# Patient Record
Sex: Male | Born: 1935
Health system: Southern US, Community
[De-identification: ages and names within clinical notes are randomized; demographics above are authoritative.]

---

## 2016-12-07 ENCOUNTER — Inpatient Hospital Stay
Admission: RE | Admit: 2016-12-07 | Discharge: 2017-01-11 | Disposition: E | Payer: Self-pay | Source: Hospice | Attending: Internal Medicine | Admitting: Internal Medicine

## 2016-12-07 ENCOUNTER — Other Ambulatory Visit (HOSPITAL_COMMUNITY): Payer: Self-pay

## 2016-12-07 DIAGNOSIS — Z95828 Presence of other vascular implants and grafts: Secondary | ICD-10-CM

## 2016-12-07 DIAGNOSIS — Z01818 Encounter for other preprocedural examination: Secondary | ICD-10-CM

## 2016-12-07 DIAGNOSIS — J9600 Acute respiratory failure, unspecified whether with hypoxia or hypercapnia: Secondary | ICD-10-CM

## 2016-12-07 DIAGNOSIS — Z9911 Dependence on respirator [ventilator] status: Secondary | ICD-10-CM

## 2016-12-07 DIAGNOSIS — J189 Pneumonia, unspecified organism: Secondary | ICD-10-CM

## 2016-12-07 MED ORDER — IOPAMIDOL (ISOVUE-300) INJECTION 61%
INTRAVENOUS | Status: AC
  Administered 2016-12-07: 30 mL via GASTROSTOMY
  Filled 2016-12-07: qty 50

## 2016-12-08 ENCOUNTER — Other Ambulatory Visit (HOSPITAL_COMMUNITY): Payer: Self-pay

## 2016-12-08 LAB — BLOOD GAS, ARTERIAL
ACID-BASE EXCESS: 0.7 mmol/L (ref 0.0–2.0)
Acid-Base Excess: 3.7 mmol/L — ABNORMAL HIGH (ref 0.0–2.0)
BICARBONATE: 24.8 mmol/L (ref 20.0–28.0)
BICARBONATE: 27.1 mmol/L (ref 20.0–28.0)
FIO2: 100
O2 Content: 2 L/min
O2 SAT: 97.4 %
O2 Saturation: 96.6 %
PATIENT TEMPERATURE: 96.5
PCO2 ART: 37.3 mmHg (ref 32.0–48.0)
PH ART: 7.476 — AB (ref 7.350–7.450)
PO2 ART: 96 mmHg (ref 83.0–108.0)
Patient temperature: 98.6
pCO2 arterial: 37.2 mmHg (ref 32.0–48.0)
pH, Arterial: 7.432 (ref 7.350–7.450)
pO2, Arterial: 84.2 mmHg (ref 83.0–108.0)

## 2016-12-08 LAB — COMPREHENSIVE METABOLIC PANEL
ALT: 41 U/L (ref 17–63)
ANION GAP: 10 (ref 5–15)
AST: 42 U/L — ABNORMAL HIGH (ref 15–41)
Albumin: 2.9 g/dL — ABNORMAL LOW (ref 3.5–5.0)
Alkaline Phosphatase: 82 U/L (ref 38–126)
BILIRUBIN TOTAL: 1.3 mg/dL — AB (ref 0.3–1.2)
BUN: 35 mg/dL — ABNORMAL HIGH (ref 6–20)
CO2: 26 mmol/L (ref 22–32)
Calcium: 9 mg/dL (ref 8.9–10.3)
Chloride: 104 mmol/L (ref 101–111)
Creatinine, Ser: 1.05 mg/dL (ref 0.61–1.24)
GFR calc Af Amer: >60 mL/min (ref >60)
Glucose, Bld: 113 mg/dL — ABNORMAL HIGH (ref 65–99)
POTASSIUM: 4.6 mmol/L (ref 3.5–5.1)
Sodium: 140 mmol/L (ref 135–145)
TOTAL PROTEIN: 6.7 g/dL (ref 6.5–8.1)

## 2016-12-08 LAB — URINALYSIS, ROUTINE W REFLEX MICROSCOPIC: SQUAMOUS EPITHELIAL / LPF: NONE SEEN

## 2016-12-08 LAB — CBC WITH DIFFERENTIAL/PLATELET
BASOS ABS: 0.1 10*3/uL (ref 0.0–0.1)
Basophils Relative: 0 %
Eosinophils Absolute: 0.2 10*3/uL (ref 0.0–0.7)
Eosinophils Relative: 1 %
HEMATOCRIT: 47.7 % (ref 39.0–52.0)
Hemoglobin: 15.5 g/dL (ref 13.0–17.0)
LYMPHS PCT: 9 %
Lymphs Abs: 2 10*3/uL (ref 0.7–4.0)
MCH: 30.1 pg (ref 26.0–34.0)
MCHC: 32.5 g/dL (ref 30.0–36.0)
MCV: 92.6 fL (ref 78.0–100.0)
Monocytes Absolute: 1.4 10*3/uL — ABNORMAL HIGH (ref 0.1–1.0)
Monocytes Relative: 6 %
NEUTROS ABS: 19.4 10*3/uL — AB (ref 1.7–7.7)
Neutrophils Relative %: 84 %
PLATELETS: 353 10*3/uL (ref 150–400)
RBC: 5.15 MIL/uL (ref 4.22–5.81)
RDW: 14 % (ref 11.5–15.5)
WBC: 23 10*3/uL — AB (ref 4.0–10.5)

## 2016-12-08 LAB — C DIFFICILE QUICK SCREEN W PCR REFLEX
C DIFFICILE (CDIFF) INTERP: NOT DETECTED
C DIFFICILE (CDIFF) TOXIN: NEGATIVE
C Diff antigen: NEGATIVE

## 2016-12-08 LAB — CBC
HEMATOCRIT: 41.9 % (ref 39.0–52.0)
HEMOGLOBIN: 13.4 g/dL (ref 13.0–17.0)
MCH: 30 pg (ref 26.0–34.0)
MCHC: 32 g/dL (ref 30.0–36.0)
MCV: 93.9 fL (ref 78.0–100.0)
Platelets: 392 10*3/uL (ref 150–400)
RBC: 4.46 MIL/uL (ref 4.22–5.81)
RDW: 14.1 % (ref 11.5–15.5)
WBC: 27.3 10*3/uL — ABNORMAL HIGH (ref 4.0–10.5)

## 2016-12-08 LAB — PROTIME-INR
INR: 1.11
INR: 1.35
PROTHROMBIN TIME: 16.8 s — AB (ref 11.4–15.2)
Prothrombin Time: 14.3 seconds (ref 11.4–15.2)

## 2016-12-08 LAB — APTT: APTT: 30 s (ref 24–36)

## 2016-12-08 LAB — PHOSPHORUS: PHOSPHORUS: 3.5 mg/dL (ref 2.5–4.6)

## 2016-12-08 LAB — MAGNESIUM: MAGNESIUM: 2.4 mg/dL (ref 1.7–2.4)

## 2016-12-09 LAB — COMPREHENSIVE METABOLIC PANEL
ALBUMIN: 2.4 g/dL — AB (ref 3.5–5.0)
ALT: 28 U/L (ref 17–63)
AST: 31 U/L (ref 15–41)
Alkaline Phosphatase: 79 U/L (ref 38–126)
Anion gap: 12 (ref 5–15)
BILIRUBIN TOTAL: 0.9 mg/dL (ref 0.3–1.2)
BUN: 51 mg/dL — AB (ref 6–20)
CHLORIDE: 104 mmol/L (ref 101–111)
CO2: 26 mmol/L (ref 22–32)
Calcium: 8.2 mg/dL — ABNORMAL LOW (ref 8.9–10.3)
Creatinine, Ser: 2.05 mg/dL — ABNORMAL HIGH (ref 0.61–1.24)
GFR calc Af Amer: 32 mL/min — ABNORMAL LOW (ref >60)
GFR calc non Af Amer: 27 mL/min — ABNORMAL LOW (ref >60)
GLUCOSE: 213 mg/dL — AB (ref 65–99)
POTASSIUM: 4.4 mmol/L (ref 3.5–5.1)
Sodium: 142 mmol/L (ref 135–145)
Total Protein: 5.5 g/dL — ABNORMAL LOW (ref 6.5–8.1)

## 2016-12-09 LAB — URINE CULTURE: Culture: NO GROWTH

## 2016-12-09 LAB — MAGNESIUM: Magnesium: 2.6 mg/dL — ABNORMAL HIGH (ref 1.7–2.4)

## 2016-12-09 LAB — BLOOD GAS, ARTERIAL
Acid-base deficit: 0.5 mmol/L (ref 0.0–2.0)
BICARBONATE: 23.1 mmol/L (ref 20.0–28.0)
Drawn by: 419771
FIO2: 100
LHR: 16 {breaths}/min
O2 SAT: 98.8 %
PATIENT TEMPERATURE: 98.6
PCO2 ART: 34.7 mmHg (ref 32.0–48.0)
PEEP: 5 cmH2O
PH ART: 7.439 (ref 7.350–7.450)
VT: 550 mL
pO2, Arterial: 138 mmHg — ABNORMAL HIGH (ref 83.0–108.0)

## 2016-12-09 LAB — TROPONIN I
Troponin I: 0.12 ng/mL (ref <0.03)
Troponin I: 0.19 ng/mL (ref <0.03)
Troponin I: 0.23 ng/mL (ref <0.03)
Troponin I: 0.25 ng/mL (ref <0.03)

## 2016-12-09 LAB — BASIC METABOLIC PANEL
ANION GAP: 12 (ref 5–15)
BUN: 57 mg/dL — ABNORMAL HIGH (ref 6–20)
CALCIUM: 8 mg/dL — AB (ref 8.9–10.3)
CHLORIDE: 105 mmol/L (ref 101–111)
CO2: 26 mmol/L (ref 22–32)
Creatinine, Ser: 2.16 mg/dL — ABNORMAL HIGH (ref 0.61–1.24)
GFR calc Af Amer: 30 mL/min — ABNORMAL LOW (ref >60)
GFR calc non Af Amer: 26 mL/min — ABNORMAL LOW (ref >60)
GLUCOSE: 154 mg/dL — AB (ref 65–99)
Potassium: 3.5 mmol/L (ref 3.5–5.1)
Sodium: 143 mmol/L (ref 135–145)

## 2016-12-09 LAB — CBC
HEMATOCRIT: 42.1 % (ref 39.0–52.0)
HEMOGLOBIN: 13.3 g/dL (ref 13.0–17.0)
MCH: 29.4 pg (ref 26.0–34.0)
MCHC: 31.6 g/dL (ref 30.0–36.0)
MCV: 93.1 fL (ref 78.0–100.0)
Platelets: 334 10*3/uL (ref 150–400)
RBC: 4.52 MIL/uL (ref 4.22–5.81)
RDW: 14 % (ref 11.5–15.5)
WBC: 30.9 10*3/uL — AB (ref 4.0–10.5)

## 2016-12-09 LAB — TSH: TSH: 1.786 u[IU]/mL (ref 0.350–4.500)

## 2016-12-09 LAB — PHOSPHORUS: Phosphorus: 7.5 mg/dL — ABNORMAL HIGH (ref 2.5–4.6)

## 2016-12-10 ENCOUNTER — Other Ambulatory Visit (HOSPITAL_COMMUNITY): Payer: Self-pay

## 2016-12-10 LAB — TROPONIN I
TROPONIN I: 0.17 ng/mL — AB (ref <0.03)
Troponin I: 0.18 ng/mL (ref <0.03)

## 2016-12-10 LAB — BLOOD GAS, ARTERIAL
ACID-BASE DEFICIT: 0.7 mmol/L (ref 0.0–2.0)
Bicarbonate: 22.6 mmol/L (ref 20.0–28.0)
FIO2: 90
O2 SAT: 99.9 %
PATIENT TEMPERATURE: 99.2
PCO2 ART: 32.1 mmHg (ref 32.0–48.0)
PEEP/CPAP: 5 cmH2O
PH ART: 7.463 — AB (ref 7.350–7.450)
RATE: 16 resp/min
VT: 550 mL
pO2, Arterial: 288 mmHg — ABNORMAL HIGH (ref 83.0–108.0)

## 2016-12-10 LAB — RENAL FUNCTION PANEL
ANION GAP: 12 (ref 5–15)
Albumin: 2.2 g/dL — ABNORMAL LOW (ref 3.5–5.0)
BUN: 73 mg/dL — ABNORMAL HIGH (ref 6–20)
CALCIUM: 7.7 mg/dL — AB (ref 8.9–10.3)
CO2: 24 mmol/L (ref 22–32)
Chloride: 103 mmol/L (ref 101–111)
Creatinine, Ser: 2.7 mg/dL — ABNORMAL HIGH (ref 0.61–1.24)
GFR calc Af Amer: 23 mL/min — ABNORMAL LOW (ref >60)
GFR calc non Af Amer: 20 mL/min — ABNORMAL LOW (ref >60)
GLUCOSE: 276 mg/dL — AB (ref 65–99)
POTASSIUM: 3.4 mmol/L — AB (ref 3.5–5.1)
Phosphorus: 4.4 mg/dL (ref 2.5–4.6)
SODIUM: 139 mmol/L (ref 135–145)

## 2016-12-10 LAB — CBC
HCT: 38.2 % — ABNORMAL LOW (ref 39.0–52.0)
HEMOGLOBIN: 12.4 g/dL — AB (ref 13.0–17.0)
MCH: 29.7 pg (ref 26.0–34.0)
MCHC: 32.5 g/dL (ref 30.0–36.0)
MCV: 91.4 fL (ref 78.0–100.0)
Platelets: 332 10*3/uL (ref 150–400)
RBC: 4.18 MIL/uL — ABNORMAL LOW (ref 4.22–5.81)
RDW: 14.6 % (ref 11.5–15.5)
WBC: 30.7 10*3/uL — ABNORMAL HIGH (ref 4.0–10.5)

## 2016-12-10 LAB — HEMOGLOBIN A1C
HEMOGLOBIN A1C: 6.2 % — AB (ref 4.8–5.6)
Mean Plasma Glucose: 131 mg/dL

## 2016-12-10 LAB — MAGNESIUM: Magnesium: 2.9 mg/dL — ABNORMAL HIGH (ref 1.7–2.4)

## 2016-12-11 ENCOUNTER — Other Ambulatory Visit (HOSPITAL_COMMUNITY): Payer: Self-pay

## 2016-12-11 LAB — RENAL FUNCTION PANEL
ALBUMIN: 2 g/dL — AB (ref 3.5–5.0)
Anion gap: 11 (ref 5–15)
BUN: 90 mg/dL — AB (ref 6–20)
CALCIUM: 7.9 mg/dL — AB (ref 8.9–10.3)
CO2: 23 mmol/L (ref 22–32)
CREATININE: 2.98 mg/dL — AB (ref 0.61–1.24)
Chloride: 105 mmol/L (ref 101–111)
GFR, EST AFRICAN AMERICAN: 20 mL/min — AB (ref >60)
GFR, EST NON AFRICAN AMERICAN: 17 mL/min — AB (ref >60)
Glucose, Bld: 250 mg/dL — ABNORMAL HIGH (ref 65–99)
Phosphorus: 4.5 mg/dL (ref 2.5–4.6)
Potassium: 3.9 mmol/L (ref 3.5–5.1)
SODIUM: 139 mmol/L (ref 135–145)

## 2016-12-11 LAB — CBC WITH DIFFERENTIAL/PLATELET
BASOS ABS: 0 10*3/uL (ref 0.0–0.1)
Basophils Relative: 0 %
EOS ABS: 0 10*3/uL (ref 0.0–0.7)
Eosinophils Relative: 0 %
HCT: 35.4 % — ABNORMAL LOW (ref 39.0–52.0)
Hemoglobin: 11.5 g/dL — ABNORMAL LOW (ref 13.0–17.0)
LYMPHS ABS: 1.3 10*3/uL (ref 0.7–4.0)
Lymphocytes Relative: 5 %
MCH: 29.9 pg (ref 26.0–34.0)
MCHC: 32.5 g/dL (ref 30.0–36.0)
MCV: 92.2 fL (ref 78.0–100.0)
MONO ABS: 1.3 10*3/uL — AB (ref 0.1–1.0)
MONOS PCT: 5 %
Neutro Abs: 22.5 10*3/uL — ABNORMAL HIGH (ref 1.7–7.7)
Neutrophils Relative %: 90 %
PLATELETS: 252 10*3/uL (ref 150–400)
RBC: 3.84 MIL/uL — AB (ref 4.22–5.81)
RDW: 14.8 % (ref 11.5–15.5)
WBC: 25.1 10*3/uL — AB (ref 4.0–10.5)

## 2016-12-11 LAB — CULTURE, RESPIRATORY

## 2016-12-11 LAB — MAGNESIUM: MAGNESIUM: 2.5 mg/dL — AB (ref 1.7–2.4)

## 2016-12-11 LAB — PROCALCITONIN: PROCALCITONIN: 0.52 ng/mL

## 2016-12-11 LAB — CULTURE, RESPIRATORY W GRAM STAIN

## 2016-12-11 DEATH — deceased

## 2016-12-12 ENCOUNTER — Other Ambulatory Visit (HOSPITAL_BASED_OUTPATIENT_CLINIC_OR_DEPARTMENT_OTHER): Payer: Self-pay

## 2016-12-12 DIAGNOSIS — I350 Nonrheumatic aortic (valve) stenosis: Secondary | ICD-10-CM

## 2016-12-12 LAB — RENAL FUNCTION PANEL
ALBUMIN: 1.7 g/dL — AB (ref 3.5–5.0)
ANION GAP: 8 (ref 5–15)
BUN: 99 mg/dL — ABNORMAL HIGH (ref 6–20)
CALCIUM: 7.8 mg/dL — AB (ref 8.9–10.3)
CO2: 27 mmol/L (ref 22–32)
Chloride: 108 mmol/L (ref 101–111)
Creatinine, Ser: 2.64 mg/dL — ABNORMAL HIGH (ref 0.61–1.24)
GFR calc non Af Amer: 20 mL/min — ABNORMAL LOW (ref >60)
GFR, EST AFRICAN AMERICAN: 23 mL/min — AB (ref >60)
Glucose, Bld: 135 mg/dL — ABNORMAL HIGH (ref 65–99)
PHOSPHORUS: 3.4 mg/dL (ref 2.5–4.6)
POTASSIUM: 3.8 mmol/L (ref 3.5–5.1)
SODIUM: 143 mmol/L (ref 135–145)

## 2016-12-12 LAB — CBC
HCT: 34.9 % — ABNORMAL LOW (ref 39.0–52.0)
HEMOGLOBIN: 11 g/dL — AB (ref 13.0–17.0)
MCH: 29 pg (ref 26.0–34.0)
MCHC: 31.5 g/dL (ref 30.0–36.0)
MCV: 92.1 fL (ref 78.0–100.0)
PLATELETS: 196 10*3/uL (ref 150–400)
RBC: 3.79 MIL/uL — AB (ref 4.22–5.81)
RDW: 14.8 % (ref 11.5–15.5)
WBC: 19 10*3/uL — ABNORMAL HIGH (ref 4.0–10.5)

## 2016-12-12 LAB — BLOOD GAS, ARTERIAL
ACID-BASE EXCESS: 2.4 mmol/L — AB (ref 0.0–2.0)
Bicarbonate: 25.4 mmol/L (ref 20.0–28.0)
FIO2: 30
O2 Saturation: 97.5 %
PEEP: 5 cmH2O
Patient temperature: 98.6
RATE: 16 resp/min
VT: 550 mL
pCO2 arterial: 33 mmHg (ref 32.0–48.0)
pH, Arterial: 7.498 — ABNORMAL HIGH (ref 7.350–7.450)
pO2, Arterial: 90 mmHg (ref 83.0–108.0)

## 2016-12-12 LAB — PROCALCITONIN: Procalcitonin: 0.34 ng/mL

## 2016-12-12 LAB — VANCOMYCIN, TROUGH: VANCOMYCIN TR: 22 ug/mL — AB (ref 15–20)

## 2016-12-12 LAB — MAGNESIUM: MAGNESIUM: 2.8 mg/dL — AB (ref 1.7–2.4)

## 2016-12-12 NOTE — Progress Notes (Signed)
  Echocardiogram 2D Echocardiogram has been performed.  Janalyn HarderWest, Judythe Postema R 12/12/2016, 9:55 AM

## 2016-12-14 LAB — CULTURE, BLOOD (ROUTINE X 2)
CULTURE: NO GROWTH
CULTURE: NO GROWTH
Special Requests: ADEQUATE
Special Requests: ADEQUATE

## 2016-12-15 LAB — CBC
HCT: 36.3 % — ABNORMAL LOW (ref 39.0–52.0)
Hemoglobin: 11 g/dL — ABNORMAL LOW (ref 13.0–17.0)
MCH: 28.8 pg (ref 26.0–34.0)
MCHC: 30.3 g/dL (ref 30.0–36.0)
MCV: 95 fL (ref 78.0–100.0)
Platelets: 235 10*3/uL (ref 150–400)
RBC: 3.82 MIL/uL — ABNORMAL LOW (ref 4.22–5.81)
RDW: 14.8 % (ref 11.5–15.5)
WBC: 13.9 10*3/uL — ABNORMAL HIGH (ref 4.0–10.5)

## 2016-12-15 LAB — BASIC METABOLIC PANEL
Anion gap: 9 (ref 5–15)
BUN: 107 mg/dL — AB (ref 6–20)
CALCIUM: 7.8 mg/dL — AB (ref 8.9–10.3)
CO2: 30 mmol/L (ref 22–32)
Chloride: 113 mmol/L — ABNORMAL HIGH (ref 101–111)
Creatinine, Ser: 1.9 mg/dL — ABNORMAL HIGH (ref 0.61–1.24)
GFR calc Af Amer: 35 mL/min — ABNORMAL LOW (ref >60)
GFR calc non Af Amer: 30 mL/min — ABNORMAL LOW (ref >60)
GLUCOSE: 130 mg/dL — AB (ref 65–99)
Potassium: 3.5 mmol/L (ref 3.5–5.1)
Sodium: 152 mmol/L — ABNORMAL HIGH (ref 135–145)

## 2016-12-15 LAB — MAGNESIUM: MAGNESIUM: 2.6 mg/dL — AB (ref 1.7–2.4)

## 2016-12-16 LAB — BASIC METABOLIC PANEL
Anion gap: 7 (ref 5–15)
BUN: 99 mg/dL — AB (ref 6–20)
CO2: 30 mmol/L (ref 22–32)
CREATININE: 1.88 mg/dL — AB (ref 0.61–1.24)
Calcium: 7.8 mg/dL — ABNORMAL LOW (ref 8.9–10.3)
Chloride: 111 mmol/L (ref 101–111)
GFR, EST AFRICAN AMERICAN: 35 mL/min — AB (ref >60)
GFR, EST NON AFRICAN AMERICAN: 31 mL/min — AB (ref >60)
Glucose, Bld: 197 mg/dL — ABNORMAL HIGH (ref 65–99)
POTASSIUM: 3.4 mmol/L — AB (ref 3.5–5.1)
SODIUM: 148 mmol/L — AB (ref 135–145)

## 2016-12-17 LAB — BASIC METABOLIC PANEL
Anion gap: 10 (ref 5–15)
BUN: 96 mg/dL — AB (ref 6–20)
CALCIUM: 7.8 mg/dL — AB (ref 8.9–10.3)
CO2: 27 mmol/L (ref 22–32)
CREATININE: 1.92 mg/dL — AB (ref 0.61–1.24)
Chloride: 114 mmol/L — ABNORMAL HIGH (ref 101–111)
GFR calc non Af Amer: 30 mL/min — ABNORMAL LOW (ref >60)
GFR, EST AFRICAN AMERICAN: 34 mL/min — AB (ref >60)
Glucose, Bld: 133 mg/dL — ABNORMAL HIGH (ref 65–99)
Potassium: 4.2 mmol/L (ref 3.5–5.1)
Sodium: 151 mmol/L — ABNORMAL HIGH (ref 135–145)

## 2016-12-18 LAB — BASIC METABOLIC PANEL
Anion gap: 10 (ref 5–15)
BUN: 104 mg/dL — ABNORMAL HIGH (ref 6–20)
CO2: 28 mmol/L (ref 22–32)
Calcium: 7.6 mg/dL — ABNORMAL LOW (ref 8.9–10.3)
Chloride: 114 mmol/L — ABNORMAL HIGH (ref 101–111)
Creatinine, Ser: 2.21 mg/dL — ABNORMAL HIGH (ref 0.61–1.24)
GFR calc Af Amer: 29 mL/min — ABNORMAL LOW (ref >60)
GFR calc non Af Amer: 25 mL/min — ABNORMAL LOW (ref >60)
Glucose, Bld: 157 mg/dL — ABNORMAL HIGH (ref 65–99)
Potassium: 3.5 mmol/L (ref 3.5–5.1)
Sodium: 152 mmol/L — ABNORMAL HIGH (ref 135–145)

## 2016-12-19 LAB — BASIC METABOLIC PANEL
Anion gap: 11 (ref 5–15)
BUN: 111 mg/dL — AB (ref 6–20)
CALCIUM: 7.5 mg/dL — AB (ref 8.9–10.3)
CO2: 28 mmol/L (ref 22–32)
CREATININE: 2.09 mg/dL — AB (ref 0.61–1.24)
Chloride: 113 mmol/L — ABNORMAL HIGH (ref 101–111)
GFR calc Af Amer: 31 mL/min — ABNORMAL LOW (ref >60)
GFR calc non Af Amer: 27 mL/min — ABNORMAL LOW (ref >60)
Glucose, Bld: 135 mg/dL — ABNORMAL HIGH (ref 65–99)
Potassium: 3.5 mmol/L (ref 3.5–5.1)
SODIUM: 152 mmol/L — AB (ref 135–145)

## 2016-12-20 LAB — URINALYSIS, ROUTINE W REFLEX MICROSCOPIC
Bilirubin Urine: NEGATIVE
Glucose, UA: NEGATIVE mg/dL
Ketones, ur: NEGATIVE mg/dL
NITRITE: NEGATIVE
PROTEIN: 30 mg/dL — AB
SPECIFIC GRAVITY, URINE: 1.013 (ref 1.005–1.030)
pH: 6 (ref 5.0–8.0)

## 2016-12-20 LAB — RENAL FUNCTION PANEL
Albumin: 1.7 g/dL — ABNORMAL LOW (ref 3.5–5.0)
Anion gap: 9 (ref 5–15)
BUN: 96 mg/dL — AB (ref 6–20)
CALCIUM: 7.6 mg/dL — AB (ref 8.9–10.3)
CO2: 30 mmol/L (ref 22–32)
CREATININE: 1.79 mg/dL — AB (ref 0.61–1.24)
Chloride: 111 mmol/L (ref 101–111)
GFR, EST AFRICAN AMERICAN: 38 mL/min — AB (ref >60)
GFR, EST NON AFRICAN AMERICAN: 32 mL/min — AB (ref >60)
Glucose, Bld: 77 mg/dL (ref 65–99)
Phosphorus: 3 mg/dL (ref 2.5–4.6)
Potassium: 3.4 mmol/L — ABNORMAL LOW (ref 3.5–5.1)
SODIUM: 150 mmol/L — AB (ref 135–145)

## 2016-12-20 LAB — CBC
HCT: 39.9 % (ref 39.0–52.0)
HEMOGLOBIN: 12.1 g/dL — AB (ref 13.0–17.0)
MCH: 28.9 pg (ref 26.0–34.0)
MCHC: 30.3 g/dL (ref 30.0–36.0)
MCV: 95.5 fL (ref 78.0–100.0)
PLATELETS: 234 10*3/uL (ref 150–400)
RBC: 4.18 MIL/uL — AB (ref 4.22–5.81)
RDW: 14.5 % (ref 11.5–15.5)
WBC: 18.4 10*3/uL — ABNORMAL HIGH (ref 4.0–10.5)

## 2016-12-20 LAB — MAGNESIUM: MAGNESIUM: 2.8 mg/dL — AB (ref 1.7–2.4)

## 2016-12-22 LAB — RENAL FUNCTION PANEL
Albumin: 1.7 g/dL — ABNORMAL LOW (ref 3.5–5.0)
Anion gap: 10 (ref 5–15)
BUN: 113 mg/dL — ABNORMAL HIGH (ref 6–20)
CO2: 29 mmol/L (ref 22–32)
Calcium: 7.6 mg/dL — ABNORMAL LOW (ref 8.9–10.3)
Chloride: 109 mmol/L (ref 101–111)
Creatinine, Ser: 2.19 mg/dL — ABNORMAL HIGH (ref 0.61–1.24)
GFR calc Af Amer: 29 mL/min — ABNORMAL LOW (ref >60)
GFR calc non Af Amer: 25 mL/min — ABNORMAL LOW (ref >60)
Glucose, Bld: 178 mg/dL — ABNORMAL HIGH (ref 65–99)
Phosphorus: 3.9 mg/dL (ref 2.5–4.6)
Potassium: 3.6 mmol/L (ref 3.5–5.1)
Sodium: 148 mmol/L — ABNORMAL HIGH (ref 135–145)

## 2016-12-22 LAB — CBC
HCT: 39.8 % (ref 39.0–52.0)
HEMOGLOBIN: 12.1 g/dL — AB (ref 13.0–17.0)
MCH: 29 pg (ref 26.0–34.0)
MCHC: 30.4 g/dL (ref 30.0–36.0)
MCV: 95.4 fL (ref 78.0–100.0)
Platelets: 260 10*3/uL (ref 150–400)
RBC: 4.17 MIL/uL — ABNORMAL LOW (ref 4.22–5.81)
RDW: 14.8 % (ref 11.5–15.5)
WBC: 21.6 10*3/uL — ABNORMAL HIGH (ref 4.0–10.5)

## 2016-12-22 LAB — URINE CULTURE

## 2016-12-22 LAB — MAGNESIUM: Magnesium: 3 mg/dL — ABNORMAL HIGH (ref 1.7–2.4)

## 2016-12-23 LAB — CULTURE, RESPIRATORY

## 2016-12-23 LAB — PROCALCITONIN: PROCALCITONIN: 0.18 ng/mL

## 2016-12-23 LAB — CULTURE, RESPIRATORY W GRAM STAIN

## 2016-12-25 LAB — PROCALCITONIN: Procalcitonin: 0.12 ng/mL

## 2016-12-26 ENCOUNTER — Other Ambulatory Visit (HOSPITAL_COMMUNITY): Payer: Self-pay

## 2016-12-27 LAB — PROCALCITONIN: Procalcitonin: <0.1 ng/mL

## 2017-01-11 DEATH — deceased

## 2018-04-15 IMAGING — DX DG CHEST 1V PORT
1 series · 1 of 1 positions shown · non-contrast
Comparison: 12/10/2016

CLINICAL DATA: Trach

EXAM:
PORTABLE CHEST 1 VIEW

[chest ap]
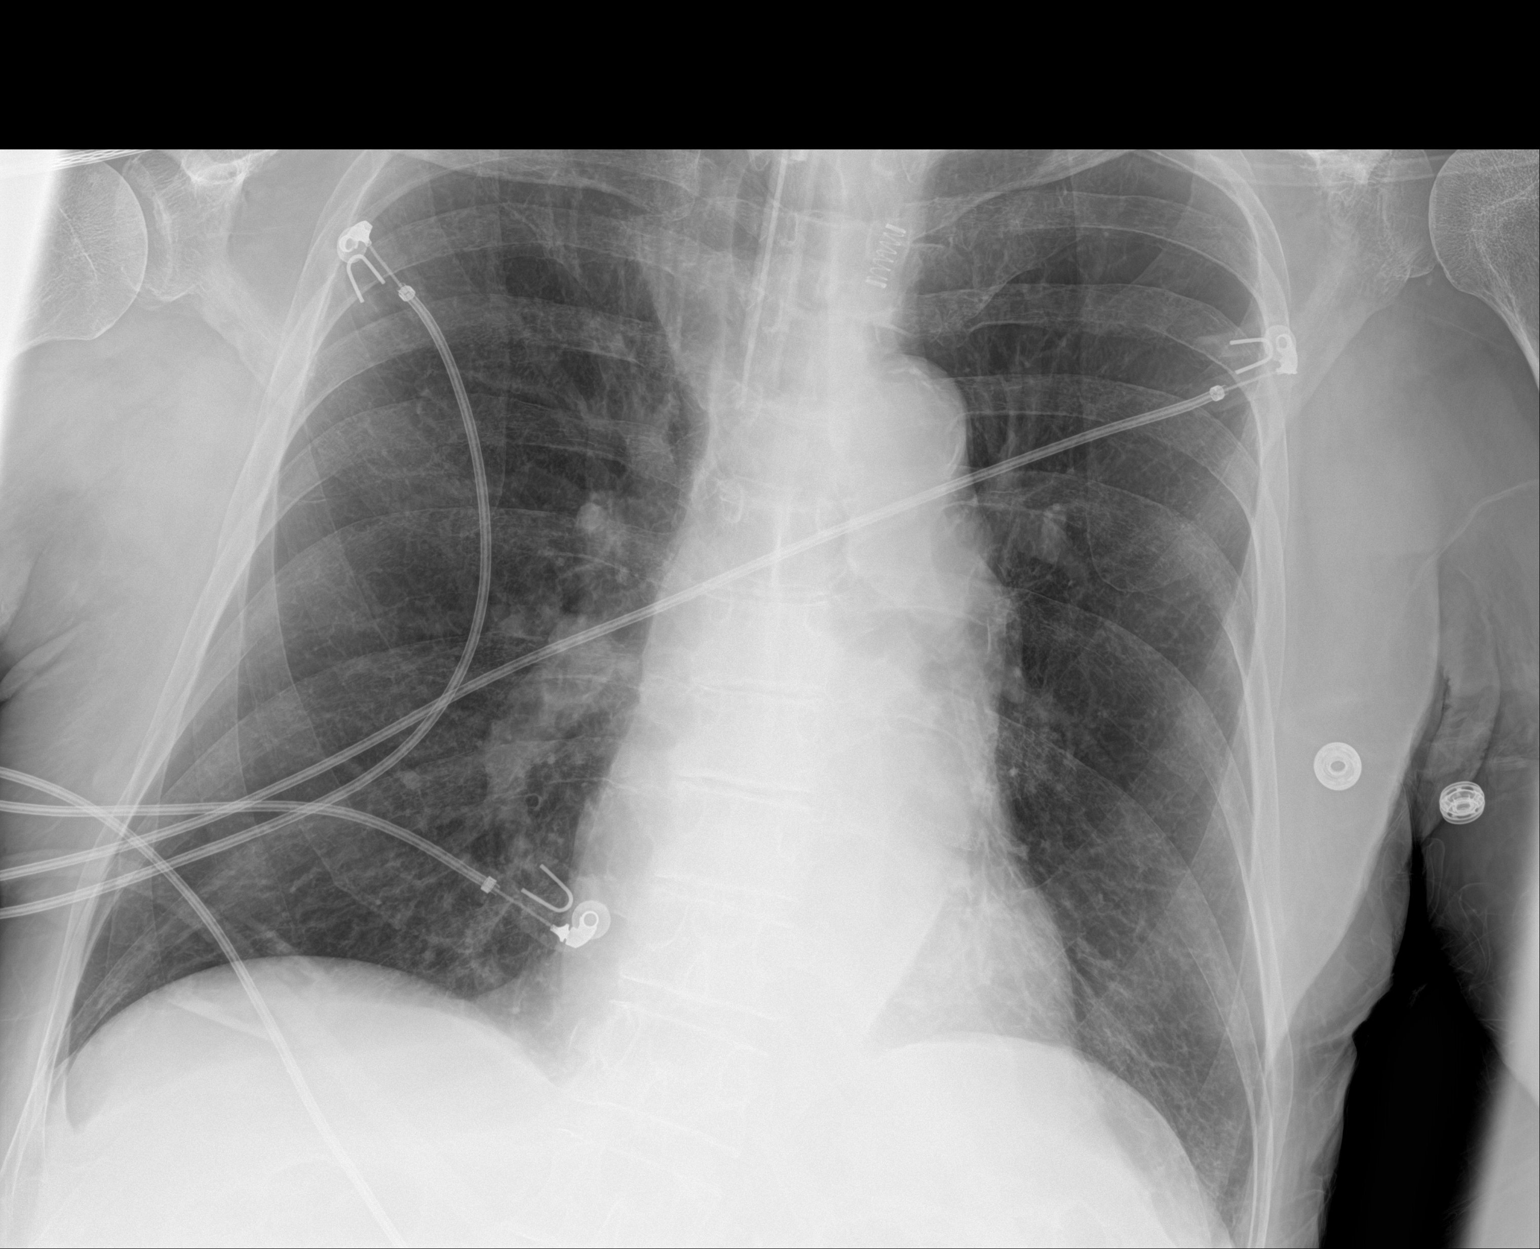

[1 of 1 positions shown; findings below may reference images not displayed]

FINDINGS: Endotracheal tube terminates 4.5 cm above the carina.

Interval removal of right arm PICC.

Lungs are essentially clear.  No pleural effusion or pneumothorax.

The heart is normal in size.
IMPRESSION: Endotracheal tube terminates 4.5 cm above the carina.
